# Patient Record
Sex: Female | Born: 1980 | Race: White | Hispanic: No | Smoking: Never smoker
Health system: Southern US, Community
[De-identification: ages and names within clinical notes are randomized; demographics above are authoritative.]

## PROBLEM LIST (undated history)

## (undated) DIAGNOSIS — J45909 Unspecified asthma, uncomplicated: Secondary | ICD-10-CM

## (undated) DIAGNOSIS — K219 Gastro-esophageal reflux disease without esophagitis: Secondary | ICD-10-CM

## (undated) DIAGNOSIS — R519 Headache, unspecified: Secondary | ICD-10-CM

## (undated) DIAGNOSIS — R17 Unspecified jaundice: Secondary | ICD-10-CM

## (undated) DIAGNOSIS — N390 Urinary tract infection, site not specified: Secondary | ICD-10-CM

## (undated) DIAGNOSIS — G43909 Migraine, unspecified, not intractable, without status migrainosus: Secondary | ICD-10-CM

## (undated) DIAGNOSIS — R51 Headache: Secondary | ICD-10-CM

## (undated) HISTORY — DX: Gastro-esophageal reflux disease without esophagitis: K21.9

## (undated) HISTORY — DX: Headache, unspecified: R51.9

## (undated) HISTORY — DX: Headache: R51

## (undated) HISTORY — DX: Unspecified asthma, uncomplicated: J45.909

## (undated) HISTORY — DX: Urinary tract infection, site not specified: N39.0

## (undated) HISTORY — DX: Migraine, unspecified, not intractable, without status migrainosus: G43.909

## (undated) HISTORY — PX: WISDOM TOOTH EXTRACTION: SHX21

## (undated) HISTORY — DX: Unspecified jaundice: R17

---
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
## 2012-07-21 LAB — HM PAP SMEAR

## 2012-10-30 ENCOUNTER — Encounter (HOSPITAL_COMMUNITY): Payer: Self-pay | Admitting: Emergency Medicine

## 2012-10-30 ENCOUNTER — Emergency Department (INDEPENDENT_AMBULATORY_CARE_PROVIDER_SITE_OTHER)
Admission: EM | Admit: 2012-10-30 | Discharge: 2012-10-30 | Disposition: A | Discharge status: Home / Self Care | Payer: BC Managed Care – PPO | Source: Home / Self Care | Attending: Family Medicine | Admitting: Family Medicine

## 2012-10-30 DIAGNOSIS — M25519 Pain in unspecified shoulder: Secondary | ICD-10-CM

## 2012-10-30 DIAGNOSIS — M545 Low back pain: Secondary | ICD-10-CM
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
## 2012-11-03 NOTE — ED Provider Notes (Signed)
Medical screening examination/treatment/procedure(s) were performed by a resident physician or non-physician practitioner and as the supervising physician I was immediately available for consultation/collaboration.  [place] Kestner, MD    Alwin Lanigan S Marvelle Caudill, MD 11/03/12 0744 

## 2012-12-07 ENCOUNTER — Encounter: Payer: Self-pay | Admitting: Family Medicine

## 2012-12-07 ENCOUNTER — Ambulatory Visit (INDEPENDENT_AMBULATORY_CARE_PROVIDER_SITE_OTHER): Payer: BC Managed Care – PPO | Admitting: Family Medicine

## 2012-12-07 VITALS — BP 110/70 | Temp 98.2°F | Ht 64.0 in | Wt 196.0 lb

## 2012-12-07 DIAGNOSIS — E669 Obesity, unspecified: Secondary | ICD-10-CM

## 2012-12-07 DIAGNOSIS — Z13220 Encounter for screening for lipoid disorders: Secondary | ICD-10-CM

## 2012-12-07 DIAGNOSIS — R946 Abnormal results of thyroid function studies: Secondary | ICD-10-CM

## 2012-12-07 DIAGNOSIS — Z23 Encounter for immunization: Secondary | ICD-10-CM

## 2012-12-07 DIAGNOSIS — Z131 Encounter for screening for diabetes mellitus: Secondary | ICD-10-CM

## 2012-12-07 DIAGNOSIS — Z7689 Persons encountering health services in other specified circumstances: Secondary | ICD-10-CM

## 2012-12-07 DIAGNOSIS — Z7189 Other specified counseling: Secondary | ICD-10-CM

## 2012-12-07 NOTE — Patient Instructions (Signed)
We recommend the following healthy lifestyle measures: - eat a healthy diet consisting of lots of vegetables, fruits, beans, nuts, seeds, healthy meats such as white chicken and fish and whole grains.  - avoid fried foods, fast food, processed foods, sodas, red meet and other fattening foods.  - get a least 150 minutes of aerobic exercise per week.   -We have ordered labs or studies at this visit. It can take up to 1-2 weeks for results and processing. We will contact you with instructions IF your results are abnormal. Normal results will be released to your [facility]. If you have not heard from [place] or can not find your results in [facility] in 2 weeks please contact our office.  -follow up for a physical at your own convenience

## 2012-12-07 NOTE — Progress Notes (Signed)
Chief Complaint  Patient presents with  . Establish Care    HPI:  Kayla Mason is here to establish care.  Last PCP and physical: Sees Dr. Chevis Pretty in gyn - goes yearly and normal exams  Has the following chronic problems and concerns today: Wants to establish care.   Abnormal TFT: - at gyn about 1 year ago - she never rechecked -no weight changes, skin issues, constipation, palpitations, heat or cold intolerance  There are no active problems to display for this patient.  Health Maintenance: -refused flu vaccine -wants tdap vaccine  ROS: See pertinent positives and negatives per HPI.  Past Medical History  Diagnosis Date  . Asthma   . Frequent headaches   . GERD (gastroesophageal reflux disease)   . Jaundice     as newborn  . Migraines   . UTI (lower urinary tract infection)     Family History  Problem Relation Age of Onset  . Arthritis Mother   . Breast cancer Mother   . Diabetes Mother   . Hyperlipidemia Father   . Hyperlipidemia Paternal Aunt   . Hypertension Paternal Aunt   . Ovarian cancer Paternal Aunt   . Prostate cancer Paternal Uncle     History   Social History  . Marital Status: Divorced    Spouse Name: N/A    Number of Children: N/A  . Years of Education: N/A   Social History Main Topics  . Smoking status: Never Smoker   . Smokeless tobacco: None  . Alcohol Use: No  . Drug Use: No  . Sexual Activity: Yes    Birth Control/ Protection: IUD   Other Topics Concern  . None   Social History Narrative   Work or School: [employer]      Home Situation: Lives with cousin and daughter      Spiritual Beliefs: Roman Catholic; Media planner      Lifestyle: walks 2x per week; diet is is not great - she does watch portion sizes             Current outpatient prescriptions:levonorgestrel (MIRENA) 20 MCG/24HR IUD, 1 each by Intrauterine route once., Disp: , Rfl: ;  ibuprofen (ADVIL,MOTRIN) 200 MG tablet, Take 200 mg by mouth every 6 (six) hours as  needed., Disp: , Rfl:   EXAM:  Filed Vitals:   12/07/12 1628  BP: 110/70  Temp: 98.2 F (36.8 C)    Body mass index is 33.63 kg/(m^2).  GENERAL: vitals reviewed and listed above, alert, oriented, appears well hydrated and in no acute distress  HEENT: atraumatic, conjunttiva clear, no obvious abnormalities on inspection of external nose and ears  NECK: no obvious masses on inspection  LUNGS: clear to auscultation bilaterally, no wheezes, rales or rhonchi, good air movement  CV: HRRR, no peripheral edema  MS: moves all extremities without noticeable abnormality  PSYCH: pleasant and cooperative, no obvious depression or anxiety  ASSESSMENT AND PLAN:  Discussed the following assessment and plan:  Encounter to establish care  Borderline abnormal TFTs - Plan: TSH, T4, Free  Screening for diabetes mellitus - Plan: Hemoglobin A1c  Screening for cholesterol level - Plan: Lipid Panel  Obesity, unspecified - Plan: Lipid Panel, Hemoglobin A1c   -We reviewed the PMH, PSH, FH, SH, Meds and Allergies. -We provided refills for any medications we will prescribe as needed. -We addressed current concerns per orders and patient instructions. -We have asked for records for pertinent exams, studies, vaccines and notes from previous providers. -We have  advised patient to follow up per instructions below.   -Patient advised to return or notify a doctor immediately if symptoms worsen or persist or new concerns arise.  Patient Instructions  We recommend the following healthy lifestyle measures: - eat a healthy diet consisting of lots of vegetables, fruits, beans, nuts, seeds, healthy meats such as white chicken and fish and whole grains.  - avoid fried foods, fast food, processed foods, sodas, red meet and other fattening foods.  - get a least 150 minutes of aerobic exercise per week.   -We have ordered labs or studies at this visit. It can take up to 1-2 weeks for results and  processing. We will contact you with instructions IF your results are abnormal. Normal results will be released to your [facility]. If you have not heard from [place] or can not find your results in [facility] in 2 weeks please contact our office.  -follow up for a physical at your own convenience             KIM, [place] R.

## 2012-12-07 NOTE — Progress Notes (Signed)
Pre visit review using our clinic review tool, if applicable. No additional management support is needed unless otherwise documented below in the visit note. 

## 2012-12-08 LAB — LIPID PANEL
Cholesterol: 145 mg/dL (ref 0–200)
HDL: 41.4 mg/dL (ref >39.00)
LDL Cholesterol: 79 mg/dL (ref 0–99)
Total CHOL/HDL Ratio: 4
Triglycerides: 124 mg/dL (ref 0.0–149.0)
VLDL: 24.8 mg/dL (ref 0.0–40.0)

## 2012-12-08 LAB — TSH: TSH: 1.03 u[IU]/mL (ref 0.35–5.50)

## 2012-12-08 LAB — HEMOGLOBIN A1C: Hgb A1c MFr Bld: 5.5 % (ref 4.6–6.5)

## 2012-12-08 NOTE — Progress Notes (Signed)
Quick Note:  Called and spoke with pt and pt is aware. ______ 

## 2012-12-08 NOTE — Addendum Note (Signed)
Addended by: Azucena Freed on: 12/08/2012 08:25 AM   Modules accepted: Orders

## 2013-01-26 ENCOUNTER — Encounter: Payer: Self-pay | Admitting: Family Medicine

## 2013-01-26 ENCOUNTER — Telehealth: Payer: Self-pay | Admitting: Family Medicine

## 2013-01-26 ENCOUNTER — Ambulatory Visit (INDEPENDENT_AMBULATORY_CARE_PROVIDER_SITE_OTHER): Payer: BC Managed Care – PPO | Admitting: Family Medicine

## 2013-01-26 VITALS — BP 120/68 | Temp 98.0°F | Wt 199.0 lb

## 2013-01-26 DIAGNOSIS — N63 Unspecified lump in unspecified breast: Secondary | ICD-10-CM

## 2013-01-26 DIAGNOSIS — L989 Disorder of the skin and subcutaneous tissue, unspecified: Secondary | ICD-10-CM

## 2013-01-26 MED ORDER — DOXYCYCLINE HYCLATE 100 MG PO TABS: 100.0000 mg | ORAL_TABLET | Freq: Two times a day (BID) | ORAL | Status: DC

## 2013-01-26 NOTE — Patient Instructions (Signed)
-  We placed a referral for you as discussed to the breast center for imaging and to the dermatologist. It usually takes about 1-2 weeks to process and schedule this referral. If you have not heard from us regarding this appointment in 2 weeks please contact our office.  --As we discussed, we have prescribed a new medication (doxycycline) for you at this appointment. We discussed the common and serious potential adverse effects of this medication and you can review these and more with the pharmacist when you pick up your medication.  Please follow the instructions for use carefully and notify us immediately if you have any problems taking this medication.  -if worsening or new symptoms follow up immediately, otherwise f/u in 3 weeks
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
## 2013-01-26 NOTE — Telephone Encounter (Signed)
Pt is scheduled to see Dr. Doreen BeamWhitworth on Friday 01/29/13 at 10 am.  I already called the pt and left her a vm with the appointment information.

## 2013-01-26 NOTE — Progress Notes (Signed)
Pre visit review using our clinic review tool, if applicable. No additional management support is needed unless otherwise documented below in the visit note. 

## 2013-01-27 ENCOUNTER — Other Ambulatory Visit: Payer: Self-pay | Admitting: Family Medicine

## 2013-01-27 ENCOUNTER — Ambulatory Visit
Admission: RE | Admit: 2013-01-27 | Discharge: 2013-01-27 | Disposition: A | Discharge status: Home / Self Care | Payer: BC Managed Care – PPO | Source: Ambulatory Visit | Attending: Family Medicine | Admitting: Family Medicine

## 2013-01-27 DIAGNOSIS — N63 Unspecified lump in unspecified breast: Secondary | ICD-10-CM

## 2013-02-09 ENCOUNTER — Telehealth: Payer: Self-pay | Admitting: Family Medicine

## 2013-02-09 NOTE — Telephone Encounter (Signed)
Pt has seen dermatologist and had mammogram and would like to talk with nurse concerning the appts

## 2013-02-09 NOTE — Telephone Encounter (Signed)
Called and spoke with pt and pt states she seen dermatology and a culture was taken and it came back negative and a biopsy will be done next.  Pt states she goes back in 2 weeks to have the stitches removed.  Pt states the antibiotic given did not help pt and she is not surprised due to the culture being negative, Pt states she has a follow up with the breast center soon to do more testing as well.  Pt wanted to make sure Dr. Selena BattenKim stayed in the loop.

## 2013-02-10 ENCOUNTER — Other Ambulatory Visit: Payer: Self-pay | Admitting: Family Medicine

## 2013-02-10 ENCOUNTER — Ambulatory Visit
Admission: RE | Admit: 2013-02-10 | Discharge: 2013-02-10 | Disposition: A | Discharge status: Home / Self Care | Payer: BC Managed Care – PPO | Source: Ambulatory Visit | Attending: Family Medicine | Admitting: Family Medicine

## 2013-02-10 DIAGNOSIS — N63 Unspecified lump in unspecified breast: Secondary | ICD-10-CM

## 2013-02-16 ENCOUNTER — Ambulatory Visit (INDEPENDENT_AMBULATORY_CARE_PROVIDER_SITE_OTHER): Payer: BC Managed Care – PPO | Admitting: Family Medicine

## 2013-02-16 ENCOUNTER — Encounter: Payer: Self-pay | Admitting: Family Medicine

## 2013-02-16 VITALS — BP 100/74 | Temp 98.0°F | Wt 201.0 lb

## 2013-02-16 DIAGNOSIS — N63 Unspecified lump in unspecified breast: Secondary | ICD-10-CM
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
## 2013-02-16 NOTE — Progress Notes (Signed)
Pre visit review using our clinic review tool, if applicable. No additional management support is needed unless otherwise documented below in the visit note. 

## 2013-02-17 ENCOUNTER — Other Ambulatory Visit: Payer: Self-pay | Admitting: Dermatology

## 2013-03-08 ENCOUNTER — Ambulatory Visit (INDEPENDENT_AMBULATORY_CARE_PROVIDER_SITE_OTHER): Payer: BC Managed Care – PPO | Admitting: Infectious Disease

## 2013-03-08 ENCOUNTER — Ambulatory Visit
Admission: RE | Admit: 2013-03-08 | Discharge: 2013-03-08 | Disposition: A | Discharge status: Home / Self Care | Payer: BC Managed Care – PPO | Source: Ambulatory Visit | Attending: Infectious Disease | Admitting: Infectious Disease

## 2013-03-08 ENCOUNTER — Encounter: Payer: Self-pay | Admitting: Infectious Disease

## 2013-03-08 VITALS — BP 130/85 | HR 94 | Temp 98.4°F | Wt 201.0 lb

## 2013-03-08 DIAGNOSIS — G43909 Migraine, unspecified, not intractable, without status migrainosus: Secondary | ICD-10-CM | POA: Insufficient documentation

## 2013-03-08 DIAGNOSIS — R0609 Other forms of dyspnea: Secondary | ICD-10-CM | POA: Insufficient documentation

## 2013-03-08 DIAGNOSIS — R0989 Other specified symptoms and signs involving the circulatory and respiratory systems: Secondary | ICD-10-CM

## 2013-03-08 DIAGNOSIS — N611 Abscess of the breast and nipple: Secondary | ICD-10-CM | POA: Insufficient documentation

## 2013-03-08 DIAGNOSIS — J441 Chronic obstructive pulmonary disease with (acute) exacerbation: Secondary | ICD-10-CM

## 2013-03-08 DIAGNOSIS — N61 Mastitis without abscess: Secondary | ICD-10-CM

## 2013-03-08 DIAGNOSIS — I889 Nonspecific lymphadenitis, unspecified: Secondary | ICD-10-CM

## 2013-03-08 DIAGNOSIS — L929 Granulomatous disorder of the skin and subcutaneous tissue, unspecified: Secondary | ICD-10-CM

## 2013-03-08 DIAGNOSIS — L980 Pyogenic granuloma: Secondary | ICD-10-CM

## 2013-03-08 DIAGNOSIS — J45901 Unspecified asthma with (acute) exacerbation: Secondary | ICD-10-CM

## 2013-03-08 LAB — CBC WITH DIFFERENTIAL/PLATELET
BASOS PCT: 1 % (ref 0–1)
Basophils Absolute: 0.1 10*3/uL (ref 0.0–0.1)
Eosinophils Absolute: 0.4 10*3/uL (ref 0.0–0.7)
Eosinophils Relative: 4 % (ref 0–5)
HEMATOCRIT: 36.5 % (ref 36.0–46.0)
Hemoglobin: 12.2 g/dL (ref 12.0–15.0)
LYMPHS PCT: 28 % (ref 12–46)
Lymphs Abs: 2.7 10*3/uL (ref 0.7–4.0)
MCH: 28.6 pg (ref 26.0–34.0)
MCHC: 33.4 g/dL (ref 30.0–36.0)
MCV: 85.7 fL (ref 78.0–100.0)
MONO ABS: 1 10*3/uL (ref 0.1–1.0)
Monocytes Relative: 10 % (ref 3–12)
NEUTROS ABS: 5.6 10*3/uL (ref 1.7–7.7)
NEUTROS PCT: 57 % (ref 43–77)
Platelets: 376 10*3/uL (ref 150–400)
RBC: 4.26 MIL/uL (ref 3.87–5.11)
RDW: 13.6 % (ref 11.5–15.5)
WBC: 9.8 10*3/uL (ref 4.0–10.5)

## 2013-03-08 NOTE — Addendum Note (Signed)
Addended by: Mariea ClontsGREEN, Kendyll Huettner D on: 03/08/2013 04:17 PM   Modules accepted: Orders

## 2013-03-08 NOTE — Progress Notes (Signed)
Subjective:    Patient ID: Kayla Mason, female    DOB: [birth year], 33 y.o.   MRN: [id number]  HPI  33 year old Caucasian lady with past medical history significant for migraine headaches to this November developed a lesion on her back. She states this was shortly before Thanksgiving is an area that became indurated red warm and tender to the touch. She tried to drain this her self by cleaning off the or at with paralyzing material in place and a needle in there. Blood return. Unfortunately the lesion did not resolve but persisted. Shortly before Christmas when she returned to [place] to visit her mother on Christmas Eve her mother examine the area and also tried to drain it again with a needle. Again only bloody material came forth. This warm tender nodule on her back has persisted since November through present date. Since then she also began noticing on New Year's Eve on 01/20/2013 a hard round area her left breast near her lymph nodes. This August he also distress tear.  She sought evaluation with her primary care doctor on 01/26/2013.  Primary care physician started her on doxycycline and referred her to dermatology as well as for an ultrasound of the breast.  Patient was seen by Dr. Jeanett Schlein [facility] on January [facility]. On exam he noted a small nontender breast nodule on the lateral left breast as well as a warm fluctuant area on her back. He performed an incision on the lesion on the back which yielded copious thin yellow discharge. The area diminished in size. Cultures were sent for bacteria and these were negative.  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 The histologic picture was thought to be consistent with that found with cat scratch disease. The Warthin's starry stain was negative. The GMS PAS and AFB stains were also all negative. I do not think that any cultures were sent from that breast biopsy.  Dr. Elvera Lennox performed a biopsy on the left upper back area via punch biopsy technique and this also showed: Supportive and granulomatous dermatitis and panniculitis. This was also looked at with AFB and PAS stains which were negative. Cultures done on antibiotics and yielded no organism. Since then he repeated another biopsy on January 28 which was a punch biopsy mid upper back which showed what looked like an epidermoid cyst with inflamed or disrupted findings. There is no atypia and then PAS and fight and AFB stains were all negative. I believe AFB and fungal cultures were obtained at that time.  Is concern for possible Bartonella infection Dr. Elvera Lennox sent for serologies for Bartonella . All of these were negative. With her saw the patient again on February 11 was done the patient had worsening of her lateral breast lesion as well as worsening induration and erythema the skin. He injected some Kenalog into the back lesion and drained also the large bore needle. Patient was also given azithromycin course which she is just finishing.  On close questioning the patient has never had problems with recurrent infections as a child she does over the migraine headaches at one point had a central line inserted while she was in [place].  [place] having recently begun graduate school at [employer]. She has traveled to [place] and kayak along the [place] in an area part of [place]. She has no known tuberculosis history of exposure  She is a monogamous relationship with her partner.  She states she was likely tested for HIV when she donated blood to  the blood test back.  She does not have a family history that is positive for connective tissue disease sarcoidosis. There is also no history of inflammatory bowel disease  Review of systems she noted recent intermittent dyspnea on exertion cause of which is unclear. His been happening for the last month. Review of Systems  Constitutional: Positive for appetite change and fatigue. Negative for fever, chills, diaphoresis, activity change and unexpected weight change.  HENT: Negative for congestion, rhinorrhea, sinus pressure, sneezing, sore throat and trouble swallowing.   Eyes: Negative for photophobia and visual disturbance.  Respiratory: Positive for shortness of breath. Negative for cough, chest tightness, wheezing and stridor.   Cardiovascular: Negative for chest pain, palpitations and leg swelling.  Gastrointestinal: Negative for nausea, vomiting, abdominal pain, diarrhea, constipation, blood in stool, abdominal distention and anal bleeding.  Genitourinary: Negative for dysuria, hematuria, flank pain and difficulty urinating.  Musculoskeletal: Negative for arthralgias, back pain, gait problem, joint swelling and myalgias.  Skin: Positive for color change and rash. Negative for pallor and wound.  Neurological: Negative for dizziness, tremors, weakness and light-headedness.  Hematological: Positive for adenopathy. Does not bruise/bleed easily.  Psychiatric/Behavioral: Negative for behavioral problems, confusion, sleep disturbance, dysphoric mood, decreased concentration and agitation.       Objective:   Physical Exam  Constitutional: She is oriented to person, place, and time. She appears well-developed and well-nourished. No distress.  HENT:  Head: Normocephalic and atraumatic.  Mouth/Throat: Oropharynx is clear and moist. No oropharyngeal exudate.  Eyes: Conjunctivae and EOM are normal. No scleral icterus.  Neck: Normal range of motion. Neck supple. No JVD present.  Cardiovascular:  Normal rate, regular rhythm and normal heart sounds.  Exam reveals no gallop and no friction rub.   No murmur heard. Pulmonary/Chest: Effort normal and breath sounds normal. No respiratory distress. She has no wheezes. She has no rales. She exhibits no tenderness.    Abdominal: She exhibits no distension. There is no tenderness. There is no rebound and no guarding.  Musculoskeletal: She exhibits no edema and no tenderness.  Lymphadenopathy:    She has no cervical adenopathy.  Neurological: She is alert and oriented to person, place, and time. She exhibits normal muscle tone. Coordination normal.  Skin: Skin is warm and dry. She is not diaphoretic. No erythema. No pallor.  Psychiatric: She has a normal mood and affect. Her behavior is normal. Judgment and thought content normal.   Area that is indurated firm and tender to palpation, biopsy site is clean     Back lesion with erythema, scarring, also quite tender             Assessment & Plan:   Multiple necrotizing granulomas:  Fascinating patient.  Given the fact that there are multiple areas involved in apparently unrelated distribution with no clear-cut exposure history I think we clearly need to worry about possibility that this might be a declaration of chronic granulomatous disease albeit later in life than is typically the time of presentation. Also should consider inflammatory bowel disease and the possibility of Crohn's or ulcers colitis, Sarcoid, CTD.  We will check  a neutrophil burst test, immunoglobulins IgE and Immunoglobulins, HIV test, PANCA, ASCA, ACE level.   I will check CBC , CMP, ESR, CRP. I will check QUANTIFERON GOLD, cryptococcal antigen, serum Coccidioides antibodies, urine histoplasma, blastomyces antigen from urine, repeat Bartonella antibodies.  The chronicity of this condition early points to an atypical cause of infection such as a nontuberculous mycobacteria fungal infection AND to a likely abnormal  underlying immune system.  I propose placing the patient on an empiric anti NTM regimen of clarithromycin and avelox but he was very reluctant to start antibiotics without were treating.  I told her that we might forced inducers approach if we could not find a culture proven culprit here. If he really wants no more about the potential culprit organism if this is in fact infectious disease we may need repeat biopsies of the breast and/or scan.  Given her recent dyspnea on exertion a chest x-ray is also warranted in this vagus also potential targets through a bronchoscopy with BAL.  I also emphasized that we do make a diagnosis of chronic renal disease now with her right infection need a protracted course of antibiotics but she would need prophylactic antibiotics for the rest of her life.   I spent greater than 60 minutes with the patient including greater than 50% of time in face to face counsel of the patient and in coordination of their care.  Dyspnea on exertion: This is intermittent in nature as mentioned above we'll get a chest x-ray she is not normally on oral contraceptives she does have a Marina ring in place and was recently given a short course of progesterone but her symptoms preceded being given these medicines. I don't think pulmonary embolism is particularly likely.

## 2013-03-08 NOTE — Addendum Note (Signed)
Addended by: Mariea ClontsGREEN, Prince Olivier D on: 03/08/2013 04:14 PM   Modules accepted: Orders

## 2013-03-09 LAB — HIV ANTIBODY (ROUTINE TESTING W REFLEX): HIV: NONREACTIVE

## 2013-03-09 LAB — IGG, IGA, IGM
IgA: 200 mg/dL (ref 69–380)
IgG (Immunoglobin G), Serum: 1160 mg/dL (ref 690–1700)
IgM, Serum: 146 mg/dL (ref 52–322)

## 2013-03-09 LAB — IGE: IgE (Immunoglobulin E), Serum: 18.3 IU/mL (ref 0.0–180.0)

## 2013-03-09 LAB — CRYPTOCOCCAL ANTIGEN: CRYPTO AG: NEGATIVE

## 2013-03-09 LAB — ANA: ANA: NEGATIVE

## 2013-03-09 LAB — SEDIMENTATION RATE: SED RATE: 13 mm/h (ref 0–22)

## 2013-03-09 LAB — C-REACTIVE PROTEIN: CRP: <0.5 mg/dL (ref <0.60)

## 2013-03-10 LAB — SACCHAROMYCES CEREVISIAE ANTIBODIES, IGG AND IGA
Saccharomyces cerevisiae IgG: 10.2 U (ref <=20.0)
Saccharomyces cerevisiae, IgA: 6.6 U (ref <=20.0)

## 2013-03-10 LAB — QUANTIFERON TB GOLD ASSAY (BLOOD)
Interferon Gamma Release Assay: NEGATIVE
MITOGEN VALUE: 7.68 [IU]/mL
QUANTIFERON TB AG MINUS NIL: 0.02 [IU]/mL
Quantiferon Nil Value: 0.04 IU/mL
TB Ag value: 0.06 IU/mL

## 2013-03-10 LAB — PAN-ANCA
ATYPICAL P-ANCA SCREEN: NEGATIVE
Myeloperoxidase Abs: <1
c-ANCA Screen: NEGATIVE
p-ANCA Screen: NEGATIVE

## 2013-03-10 LAB — ANGIOTENSIN CONVERTING ENZYME: Angiotensin-Converting Enzyme: 21 U/L (ref 8–52)

## 2013-03-11 LAB — BARTONELLA ANITBODY PANEL

## 2013-03-11 LAB — BARTONELLA ANTIBODY PANEL
B henselae IgG: NEGATIVE
B henselae IgM: NEGATIVE

## 2013-03-11 LAB — COCCIDIOIDES ANTIBODIES

## 2013-03-13 LAB — HISTOPLASMA ANTIGEN, URINE: Histoplasma Antigen, urine: <0.5 ng/mL

## 2013-03-13 LAB — MVISTA BLASTOMYCES QNT AG, URINE

## 2013-03-22 ENCOUNTER — Encounter: Payer: Self-pay | Admitting: Infectious Disease

## 2013-03-22 ENCOUNTER — Ambulatory Visit (INDEPENDENT_AMBULATORY_CARE_PROVIDER_SITE_OTHER): Payer: BC Managed Care – PPO | Admitting: Infectious Disease

## 2013-03-22 VITALS — BP 136/82 | HR 98 | Temp 98.1°F | Ht 64.5 in | Wt 201.0 lb

## 2013-03-22 DIAGNOSIS — L929 Granulomatous disorder of the skin and subcutaneous tissue, unspecified: Secondary | ICD-10-CM

## 2013-03-22 DIAGNOSIS — L980 Pyogenic granuloma: Secondary | ICD-10-CM

## 2013-03-22 NOTE — Progress Notes (Signed)
Subjective:    Patient ID: Kayla Mason, female    DOB: 02/23/1980, 33 y.o.   MRN: [id number]  HPI   33 year old Caucasian lady with past medical history significant for migraine headaches to this November developed a lesion on her back. She states this was shortly before Thanksgiving is an area that became indurated red warm and tender to the touch. She tried to drain this her self by cleaning off the or at with paralyzing material in place and a needle in there. Blood return. Unfortunately the lesion did not resolve but persisted. Shortly before Christmas when she returned to [place] to visit her mother on Christmas Eve her mother examine the area and also tried to drain it again with a needle. Again only bloody material came forth. This warm tender nodule on her back has persisted since November through present date. Since then she also began noticing on New Year's Eve on 01/20/2013 a hard round area her left breast near her lymph nodes. This August he also distress tear.  She sought evaluation with her primary care doctor on 01/26/2013.  Primary care physician started her on doxycycline and referred her to dermatology as well as for an ultrasound of the breast.  Patient was seen by Dr. Jeanett Schlein [facility] on January 9. On exam he noted a small nontender breast nodule on the lateral left breast as well as a warm fluctuant area on her back. He performed an incision on the lesion on the back which yielded copious thin yellow discharge. The area diminished in size. Cultures were sent for bacteria and these were negative.  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 The histologic picture was thought to be consistent with that found with cat scratch disease. The Warthin's starry stain was negative. The GMS PAS and AFB stains were also all negative. I do not think that any cultures were sent from that breast biopsy.  Dr. Elvera Lennox performed a biopsy on the left upper back area via punch biopsy technique and this also showed: Supportive and granulomatous dermatitis and panniculitis. This was also looked at with AFB and PAS stains which were negative. Cultures done on antibiotics and yielded no organism. Since then he repeated another biopsy on January 28 which was a punch biopsy mid upper back which showed what looked like an epidermoid cyst with inflamed or disrupted findings. There is no atypia and then PAS and fight and AFB stains were all negative. I believe AFB and fungal cultures were obtained at that time.  Is concern for possible Bartonella infection Dr. Elvera Lennox sent for serologies for Bartonella . All of these were negative. With her saw the patient again on February 11 was done the patient had worsening of her lateral breast lesion as well as worsening induration and erythema the skin. He injected some Kenalog into the back lesion and drained also the large bore needle. Patient was also given azithromycin course which she is just finishing.  On close questioning the patient has never had problems with recurrent infections as a child she does over the migraine headaches at one point had a central line inserted while she was in [place].  She has  lived largely in the [place] but also in the [place] having recently begun graduate school at [employer]. She has traveled to [place] and [place] in an area part of [place]. She has no known tuberculosis history of exposure  She is a monogamous relationship with her partner.  She states she was likely tested for HIV when she donated blood to  the blood test back.  She does not have a family history that is positive for connective tissue disease sarcoidosis. There is also no history of inflammatory bowel disease.   We initiated further workup here including for infection (negative histoplasma antigen, blastomyces antigen. Serum cryptococcal antigen, negative quantiferon gold. We tested her again for HIV and this was negative.Repeat Bartonella abs were negative. Cocci abs were sent.  We assessed her Neutrophil function with DHR test and it was normal. Immunoglobulins were normal. ESR was normal. ANA negative. ANCA's negative. ACE level negative. I sent ASCA antibodies which were negative (in case this was Crohn's extra-intestinal manifestation. )  Since I last saw her she has remained off antibiotics. Her lesion on her back is becoming more full and tender and she is developing apparent new lesions adjacent to her lateral chest wall that are tender. She is without fevers, chills, malaise. I still cannot isolate any specific common exposure for these sites--such as hot tub etc. She has AFB and fungal cultures incubating at Dr. Juanna Cao office.  . Review of Systems  Constitutional: Positive for appetite change and fatigue. Negative for fever, chills, diaphoresis, activity change and unexpected weight change.  HENT: Negative for congestion, rhinorrhea, sinus pressure, sneezing, sore throat and trouble swallowing.   Eyes: Negative for photophobia and visual disturbance.  Respiratory: Positive for shortness of breath. Negative for cough, chest tightness, wheezing and stridor.   Cardiovascular: Negative for chest pain, palpitations and leg swelling.  Gastrointestinal: Negative for nausea, vomiting, abdominal pain, diarrhea, constipation, blood in stool, abdominal distention and anal bleeding.  Genitourinary: Negative for dysuria, hematuria, flank pain and difficulty urinating.  Musculoskeletal: Negative for arthralgias, back pain, gait  problem, joint swelling and myalgias.  Skin: Positive for color change and rash. Negative for pallor and wound.  Neurological: Negative for dizziness, tremors, weakness and light-headedness.  Hematological: Positive for adenopathy. Does not bruise/bleed easily.  Psychiatric/Behavioral: Negative for behavioral problems, confusion, sleep disturbance, dysphoric mood, decreased concentration and agitation.       Objective:   Physical Exam  Constitutional: She is oriented to person, place, and time. She appears well-developed and well-nourished. No distress.  HENT:  Head: Normocephalic and atraumatic.  Mouth/Throat: Oropharynx is clear and moist. No oropharyngeal exudate.  Eyes: Conjunctivae and EOM are normal. No scleral icterus.  Neck: Normal range of motion. Neck supple. No JVD present.  Cardiovascular: Normal rate, regular rhythm and normal heart sounds.  Exam reveals no gallop and no friction rub.   No murmur heard. Pulmonary/Chest: Effort normal and breath sounds normal. No respiratory distress. She has no wheezes. She has no rales. She exhibits no tenderness.    Abdominal: She exhibits no distension. There is no tenderness. There is no rebound and no guarding.  Musculoskeletal: She exhibits no edema and no tenderness.  Lymphadenopathy:    She has no cervical adenopathy.  Neurological: She is alert and oriented to person, place, and time. She exhibits normal muscle tone. Coordination normal.  Skin: Skin is warm and dry. She is not diaphoretic. No erythema. No pallor.  Psychiatric: She has a normal mood  and affect. Her behavior is normal. Judgment and thought content normal.   Area that is indurated firm and tender to palpation, biopsy site is clean  Last exam 03/08/13 :      TODAY's eXAM 03/22/13:       Back lesion with erythema, scarring, also quite tender  LAST EXAM 2/16 / 15       TODAY's exam 03/22/13:         Assessment & Plan:   Multiple necrotizing  granulomas:  Fascinating case. I do begin to wonder more and more about a non-ID cause given chronicity and fact that one lesion responded to injection of steroid--though the other one (breast/chest wall) also improved without steroids.  I am sending tissue trans glutaminase in case this is an extra intestinal manifestation of Celiac.  I would like to see what cultures done by Dr. Elvera Lennox have shown  I would, if we cannot get a definitive diagnosis be in favor of repeat more aggressive biopsy. If lesions continue to grow perhaps Dr. Elvera Lennox could take a large sample , or we could even consider surgical excision. With a large section, repeat pathological exam could be performed along with repeat stains for AFB, fungi, bacteria, stains for Whipple's and certainly DEDICATED TISSUE for AFB stain and culture, and fungal stain and culture as well as culture for Nocardia (on buffered charcoal yeast agar)   I spent greater than 25 minutes with the patient including greater than 50% of time in face to face counsel of the patient and in coordination of their care.

## 2013-03-23 LAB — TISSUE TRANSGLUTAMINASE, IGA: TISSUE TRANSGLUTAMINASE AB, IGA: 2.9 U/mL (ref <20)

## 2013-03-31 ENCOUNTER — Telehealth: Payer: Self-pay | Admitting: Oncology

## 2013-03-31 NOTE — Telephone Encounter (Signed)
S/W PATIENT AND GAVE NEW PATIENT APPT FOR 03/12 @ 1:30 W/DR.SHADAD.  REFERRING DR. Corliss SkainsEVESHWAR DX-LYMPHADENITIS

## 2013-03-31 NOTE — Telephone Encounter (Signed)
C/D 03/31/13 for appt. 04/01/13 °

## 2013-04-01 ENCOUNTER — Encounter: Payer: Self-pay | Admitting: Oncology

## 2013-04-01 ENCOUNTER — Ambulatory Visit (HOSPITAL_BASED_OUTPATIENT_CLINIC_OR_DEPARTMENT_OTHER): Payer: BC Managed Care – PPO | Admitting: Oncology

## 2013-04-01 ENCOUNTER — Other Ambulatory Visit: Payer: BC Managed Care – PPO

## 2013-04-01 ENCOUNTER — Ambulatory Visit: Payer: BC Managed Care – PPO

## 2013-04-01 ENCOUNTER — Telehealth: Payer: Self-pay | Admitting: Oncology

## 2013-04-01 VITALS — BP 150/77 | HR 66 | Temp 98.3°F | Resp 20 | Ht 64.5 in | Wt 200.8 lb

## 2013-04-01 DIAGNOSIS — L929 Granulomatous disorder of the skin and subcutaneous tissue, unspecified: Secondary | ICD-10-CM

## 2013-04-01 DIAGNOSIS — I881 Chronic lymphadenitis, except mesenteric: Secondary | ICD-10-CM

## 2013-04-01 NOTE — Progress Notes (Signed)
Please see consult note.  

## 2013-04-01 NOTE — Progress Notes (Signed)
Checked in new pt with no financial concerns. °

## 2013-04-01 NOTE — Telephone Encounter (Signed)
gv pt barium °

## 2013-04-01 NOTE — Consult Note (Signed)
Reason for Referral: Lymphadenitis.   HPI: 33 year old woman currently of [place] for the last 4-1/2 years. She lived in multiple locations most recently in the [place] area. She does have a past medical history significant for migraine headaches otherwise healthy. She was in her normal state of health still  November on 2014 when developed a lesion on her back. This warm tender nodule on her back has persisted since November through present date. Since then she also began noticing on New Year's Eve on 01/20/2013 a hard round area her left breast near her lymph nodes. He was evaluated by her rimary care physician and started her on doxycycline and referred her to dermatology as well as for an ultrasound of the breast.  Patient was seen by Dr. Jeanett Schlein from [facility] on January 9. On exam he noted a small nontender breast nodule on the lateral left breast as well as a warm fluctuant area on her back. He performed an incision on the lesion on the back which yielded copious thin yellow discharge. The area diminished in size. Cultures were sent for bacteria and these were negative.  He was also evaluated by the breast Center her potential chest wall mass/breast mass. 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 The histologic picture was thought to be consistent with that found with cat scratch disease. The Warthin's starry stain was negative. The GMS PAS and AFB stains were also all negative.   Dr. Elvera Lennox performed a biopsy on the left upper back area via punch biopsy technique and this also showed: Supportive and granulomatous dermatitis and panniculitis.   She was evaluated by Dr. Tommy Medal from infectious disease and her evaluation have been unremarkable. She had normal vital serology including HIV among other panel.  Shows other normals CBC and differential.   She will is referred to me for evaluation regarding these findings. Clinically otherwise, she is completely asymptomatic. She is not reporting any fevers or chills or sweats. She has not reported any weight loss her major appetite changes. Continue to perform activities of daily living without any hindrance or decline.   Past Medical History  Diagnosis Date  . Asthma   . Frequent headaches   . GERD (gastroesophageal reflux disease)   . Jaundice     as newborn  . Migraines   . UTI (lower urinary tract infection)   :  Past Surgical History  Procedure Laterality Date  . Pic line/shunt  1998    for migraines/hospitalization  . Wisdom tooth extraction    :  Current Outpatient Prescriptions  Medication Sig Dispense Refill  . cetirizine (ZYRTEC) 10 MG tablet Take 10 mg by mouth daily.      Marland Kitchen ibuprofen (ADVIL,MOTRIN) 200 MG tablet Take 200 mg by mouth every 6 (six) hours as needed.       No current facility-administered medications for this visit.     Allergies  Allergen Reactions  . Witch Hazel   :  Family History  Problem Relation Age of Onset  . Arthritis Mother   . Breast cancer Mother   . Diabetes Mother   . Hyperlipidemia Father   . Hyperlipidemia Paternal Aunt   . Hypertension Paternal Aunt   . Ovarian cancer Paternal Aunt   . Prostate cancer Paternal Uncle   :  History   Social History  . Marital Status: Divorced  Spouse Name: N/A    Number of Children: N/A  . Years of Education: N/A   Occupational History  . Not on file.   Social History Main Topics  . Smoking status: Never Smoker   . Smokeless tobacco: Never Used  . Alcohol Use: No  . Drug Use: No  . Sexual Activity: Yes    Birth Control/ Protection: IUD   Other Topics Concern  . Not on file   Social History Narrative   Work or School: [employer]      Home Situation: Lives with cousin and daughter      Spiritual Beliefs: Roman Nurse, learning disability; Clinical cytogeneticist       Lifestyle: walks 2x per week; diet is is not great - she does watch portion sizes           :  Constitutional: negative for anorexia, chills, fatigue and fevers Eyes: negative for icterus and irritation Ears, nose, mouth, throat, and face: negative for nasal congestion, snoring and sore mouth Respiratory: negative for asthma, cough and dyspnea on exertion Cardiovascular: negative for chest pain, chest pressure/discomfort and dyspnea Gastrointestinal: negative for abdominal pain, diarrhea, melena and nausea Genitourinary:negative for dysuria, frequency and hematuria Integument/breast: negative for nipple discharge and pruritus Hematologic/lymphatic: negative for bleeding, easy bruising and petechiae Musculoskeletal:negative for arthralgias, back pain and bone pain Neurological: negative for dizziness, seizures and vertigo Behavioral/Psych: negative for anxiety and depression Endocrine: negative for temperature intolerance Allergic/Immunologic: negative for hay fever and urticaria  Exam: Blood pressure 150/77, pulse 66, temperature 98.3 F (36.8 C), temperature source Oral, resp. rate 20, height 5' 4.5" (1.638 m), weight 200 lb 12.8 oz (91.082 kg), SpO2 100.00%. General appearance: alert, cooperative and appears stated age Nose: Nares normal. Septum midline. Mucosa normal. No drainage or sinus tenderness. Throat: lips, mucosa, and tongue normal; teeth and gums normal Neck: no adenopathy, no carotid bruit, no JVD, supple, symmetrical, trachea midline and thyroid not enlarged, symmetric, no tenderness/mass/nodules Back: symmetric, no curvature. ROM normal. No CVA tenderness., Slight erythema and induration noted on the left side of her mid back. Resp: clear to auscultation bilaterally Chest wall: no tenderness Cardio: regular rate and rhythm, S1, S2 normal, no murmur, click, rub or gallop GI: soft, non-tender; bowel sounds normal; no masses,  no organomegaly Extremities: extremities  normal, atraumatic, no cyanosis or edema Pulses: 2+ and symmetric Skin: Skin color, texture, turgor normal. No rashes or lesions Lymph nodes: Cervical, supraclavicular, and axillary nodes normal.   Assessment and Plan:   33 year old woman with a recent finding and a diagnosis of granulomatous necrotizing lymphadenitis. This was discovered on a lesion in her back as well as a lymph node biopsy that was done and confirmed the presence of this finding. She was evaluated by infectious disease and her viral serology was unremarkable. I see no evidence to suggest any malignant disorder she has a normal CBC as well as a benign biopsy at this time. The natural course of this disease as well as the differential diagnosis was discussed with the patient today. This could also be a sign of histiocytic necrotizing lymphadenitis also known as Kikuchi disease as well as could also be a sign of an autoimmune disorder. At this point I see no evidence to suggest any malignancy but certainly she could have an indolent lymphoma and could be manifesting itself with these abnormal lymph glands finding. For completeness sake I will obtain a CT scan of the neck chest abdomen and pelvis and there is no lymphadenopathy noted  on his exam I would probably do not feel that any further testing would be needed. I do not think that she needs a bone marrow biopsy and I have possibly would recommend a rheumatology evaluation if she continued to has diffuse involvement with this condition. All her questions are answered today

## 2013-04-06 ENCOUNTER — Telehealth: Payer: Self-pay | Admitting: *Deleted

## 2013-04-06 NOTE — Telephone Encounter (Signed)
Patient called requesting her office notes and labs be faxed to Dr. Orlin HildingAngela Mason, rheumatology. She will come by the office to sign a release. Kayla MolaJacqueline Mason

## 2013-04-27 ENCOUNTER — Encounter (HOSPITAL_COMMUNITY): Payer: Self-pay | Admitting: Pharmacist

## 2013-05-04 ENCOUNTER — Ambulatory Visit: Payer: BC Managed Care – PPO | Admitting: Infectious Disease

## 2013-05-05 NOTE — H&P (Addendum)
  33 yo with irregular bleeding and endometrial polyps presents for surgical mngt.  PMHx:  Migraines, Asthma PShx:  SVD All:  Witch hazel, phenobarbital, tylenol Meds:  Zyrtec, ibuprofen prn SHx:  No tobacco FHx:  H/o liver, ovarian, prostate, skin ca Gyn Hx:  LMP 4d ago.  Using OCP and condoms for contraception  AF, VSS Gen - NAD Abd - soft, NT CV - RRR Lungs - clear Ext - NT  US: 1.5 x 1.6cm polypoid mass in endometrial cavity.    A/P:  Irregular VB and enodmetrial polyps Hysteroscopy, D&C, removal of endometrial mass R/b/a discussed, questions answered, informed consent

## 2013-05-06 ENCOUNTER — Encounter (HOSPITAL_COMMUNITY): Payer: Self-pay | Admitting: Anesthesiology

## 2013-05-06 NOTE — Anesthesia Preprocedure Evaluation (Addendum)
Anesthesia Evaluation  Patient identified by MRN, date of birth, ID band Patient awake    Reviewed: Allergy & Precautions, H&P , NPO status , Patient's Chart, lab work & pertinent test results  Airway Mallampati: II TM Distance: >3 FB Neck ROM: Full    Dental no notable dental hx. (+) Teeth Intact   Pulmonary shortness of breath and with exertion, asthma ,  breath sounds clear to auscultation  Pulmonary exam normal       Cardiovascular + DOE Rhythm:Regular Rate:Normal     Neuro/Psych  Headaches, negative psych ROS   GI/Hepatic Neg liver ROS, GERD-  Medicated and Controlled,  Endo/Other  Obesity  Renal/GU negative Renal ROS  negative genitourinary   Musculoskeletal negative musculoskeletal ROS (+)   Abdominal (+) + obese,   Peds  Hematology negative hematology ROS (+)   Anesthesia Other Findings   Reproductive/Obstetrics Endometrial polyps                          Anesthesia Physical Anesthesia Plan  ASA: II  Anesthesia Plan: General   Post-op Pain Management:    Induction: Intravenous  Airway Management Planned: LMA  Additional Equipment:   Intra-op Plan:   Post-operative Plan: Extubation in OR  Informed Consent: I have reviewed the patients History and Physical, chart, labs and discussed the procedure including the risks, benefits and alternatives for the proposed anesthesia with the patient or authorized representative who has indicated his/her understanding and acceptance.   Dental advisory given  Plan Discussed with: Anesthesiologist  Anesthesia Plan Comments:         Anesthesia Quick Evaluation

## 2013-05-07 ENCOUNTER — Encounter (HOSPITAL_COMMUNITY): Payer: BC Managed Care – PPO | Admitting: Anesthesiology

## 2013-05-07 ENCOUNTER — Ambulatory Visit (HOSPITAL_COMMUNITY): Payer: BC Managed Care – PPO | Admitting: Anesthesiology

## 2013-05-07 ENCOUNTER — Ambulatory Visit (HOSPITAL_COMMUNITY)
Admission: RE | Admit: 2013-05-07 | Discharge: 2013-05-07 | Disposition: A | Discharge status: Home / Self Care | Payer: BC Managed Care – PPO | Source: Ambulatory Visit | Attending: Obstetrics and Gynecology | Admitting: Obstetrics and Gynecology

## 2013-05-07 ENCOUNTER — Encounter (HOSPITAL_COMMUNITY): Payer: Self-pay | Admitting: Anesthesiology

## 2013-05-07 ENCOUNTER — Encounter (HOSPITAL_COMMUNITY)
Admission: RE | Disposition: A | Discharge status: Home / Self Care | Payer: Self-pay | Source: Ambulatory Visit | Attending: Obstetrics and Gynecology

## 2013-05-07 DIAGNOSIS — N938 Other specified abnormal uterine and vaginal bleeding: Secondary | ICD-10-CM | POA: Insufficient documentation

## 2013-05-07 DIAGNOSIS — K219 Gastro-esophageal reflux disease without esophagitis: Secondary | ICD-10-CM | POA: Insufficient documentation

## 2013-05-07 DIAGNOSIS — N840 Polyp of corpus uteri: Secondary | ICD-10-CM | POA: Insufficient documentation

## 2013-05-07 DIAGNOSIS — N949 Unspecified condition associated with female genital organs and menstrual cycle: Secondary | ICD-10-CM | POA: Insufficient documentation

## 2013-05-07 DIAGNOSIS — J45909 Unspecified asthma, uncomplicated: Secondary | ICD-10-CM | POA: Insufficient documentation

## 2013-05-07 HISTORY — PX: DILATATION & CURETTAGE/HYSTEROSCOPY WITH TRUECLEAR: SHX6353

## 2013-05-07 LAB — CBC
HCT: 34.8 % — ABNORMAL LOW (ref 36.0–46.0)
Hemoglobin: 11.4 g/dL — ABNORMAL LOW (ref 12.0–15.0)
MCH: 29.2 pg (ref 26.0–34.0)
MCHC: 32.8 g/dL (ref 30.0–36.0)
MCV: 89.2 fL (ref 78.0–100.0)
PLATELETS: 292 10*3/uL (ref 150–400)
RBC: 3.9 MIL/uL (ref 3.87–5.11)
RDW: 13.1 % (ref 11.5–15.5)
WBC: 8.6 10*3/uL (ref 4.0–10.5)

## 2013-05-07 SURGERY — DILATATION & CURETTAGE/HYSTEROSCOPY WITH TRUCLEAR
Anesthesia: General | Site: Vagina

## 2013-05-07 MED ORDER — MIDAZOLAM HCL 2 MG/2ML IJ SOLN
INTRAMUSCULAR | Status: AC
  Filled 2013-05-07: qty 2

## 2013-05-07 MED ORDER — MEPERIDINE HCL 25 MG/ML IJ SOLN: 6.2500 mg | INTRAMUSCULAR | Status: DC | PRN

## 2013-05-07 MED ORDER — FENTANYL CITRATE 0.05 MG/ML IJ SOLN: 100.0000 ug | Freq: Once | INTRAMUSCULAR | Status: DC

## 2013-05-07 MED ORDER — FENTANYL CITRATE 0.05 MG/ML IJ SOLN
INTRAMUSCULAR | Status: AC
  Administered 2013-05-07: 100 ug via INTRAVENOUS
  Filled 2013-05-07: qty 2

## 2013-05-07 MED ORDER — PROPOFOL 10 MG/ML IV EMUL
INTRAVENOUS | Status: AC
  Filled 2013-05-07: qty 20

## 2013-05-07 MED ORDER — ONDANSETRON HCL 4 MG/2ML IJ SOLN
INTRAMUSCULAR | Status: AC
  Filled 2013-05-07: qty 2

## 2013-05-07 MED ORDER — KETOROLAC TROMETHAMINE 30 MG/ML IJ SOLN
INTRAMUSCULAR | Status: DC | PRN
  Administered 2013-05-07: 30 mg via INTRAVENOUS

## 2013-05-07 MED ORDER — FENTANYL CITRATE 0.05 MG/ML IJ SOLN
25.0000 ug | INTRAMUSCULAR | Status: DC | PRN
  Administered 2013-05-07 (×2): 25 ug via INTRAVENOUS

## 2013-05-07 MED ORDER — CEFAZOLIN SODIUM-DEXTROSE 2-3 GM-% IV SOLR: 2.0000 g | INTRAVENOUS | Status: DC

## 2013-05-07 MED ORDER — SCOPOLAMINE 1 MG/3DAYS TD PT72
1.0000 | MEDICATED_PATCH | TRANSDERMAL | Status: DC
  Administered 2013-05-07: 1.5 mg via TRANSDERMAL

## 2013-05-07 MED ORDER — FENTANYL CITRATE 0.05 MG/ML IJ SOLN
INTRAMUSCULAR | Status: DC | PRN
  Administered 2013-05-07: 50 ug via INTRAVENOUS

## 2013-05-07 MED ORDER — FENTANYL CITRATE 0.05 MG/ML IJ SOLN
INTRAMUSCULAR | Status: AC
  Administered 2013-05-07: 25 ug via INTRAVENOUS
  Filled 2013-05-07: qty 2

## 2013-05-07 MED ORDER — METOCLOPRAMIDE HCL 5 MG/ML IJ SOLN: 10.0000 mg | Freq: Once | INTRAMUSCULAR | Status: DC | PRN

## 2013-05-07 MED ORDER — DEXAMETHASONE SODIUM PHOSPHATE 10 MG/ML IJ SOLN
INTRAMUSCULAR | Status: DC | PRN
  Administered 2013-05-07: 10 mg via INTRAVENOUS

## 2013-05-07 MED ORDER — ONDANSETRON HCL 4 MG/2ML IJ SOLN
INTRAMUSCULAR | Status: DC | PRN
  Administered 2013-05-07: 4 mg via INTRAVENOUS

## 2013-05-07 MED ORDER — LIDOCAINE HCL (CARDIAC) 20 MG/ML IV SOLN
INTRAVENOUS | Status: DC | PRN
  Administered 2013-05-07: 70 mg via INTRAVENOUS
  Administered 2013-05-07: 30 mg via INTRAVENOUS

## 2013-05-07 MED ORDER — LACTATED RINGERS IV SOLN
INTRAVENOUS | Status: DC
  Administered 2013-05-07: 07:00:00 via INTRAVENOUS

## 2013-05-07 MED ORDER — PROPOFOL 10 MG/ML IV BOLUS
INTRAVENOUS | Status: DC | PRN
  Administered 2013-05-07: 180 mg via INTRAVENOUS

## 2013-05-07 MED ORDER — SCOPOLAMINE 1 MG/3DAYS TD PT72
MEDICATED_PATCH | TRANSDERMAL | Status: AC
  Filled 2013-05-07: qty 1

## 2013-05-07 MED ORDER — LIDOCAINE HCL 1 % IJ SOLN
INTRAMUSCULAR | Status: DC | PRN
  Administered 2013-05-07: 10 mL

## 2013-05-07 MED ORDER — CEFAZOLIN SODIUM-DEXTROSE 2-3 GM-% IV SOLR
INTRAVENOUS | Status: DC | PRN
  Administered 2013-05-07: 2 g via INTRAVENOUS

## 2013-05-07 MED ORDER — LIDOCAINE HCL (CARDIAC) 20 MG/ML IV SOLN
INTRAVENOUS | Status: AC
  Filled 2013-05-07: qty 5

## 2013-05-07 MED ORDER — FENTANYL CITRATE 0.05 MG/ML IJ SOLN
INTRAMUSCULAR | Status: AC
  Filled 2013-05-07: qty 2

## 2013-05-07 MED ORDER — MIDAZOLAM HCL 2 MG/2ML IJ SOLN
INTRAMUSCULAR | Status: DC | PRN
  Administered 2013-05-07: 2 mg via INTRAVENOUS

## 2013-05-07 MED ORDER — CEFAZOLIN SODIUM-DEXTROSE 2-3 GM-% IV SOLR
INTRAVENOUS | Status: AC
  Filled 2013-05-07: qty 50

## 2013-05-07 MED ORDER — KETOROLAC TROMETHAMINE 30 MG/ML IJ SOLN
INTRAMUSCULAR | Status: AC
  Filled 2013-05-07: qty 1

## 2013-05-07 MED ORDER — DEXAMETHASONE SODIUM PHOSPHATE 10 MG/ML IJ SOLN
INTRAMUSCULAR | Status: AC
  Filled 2013-05-07: qty 1

## 2013-05-07 MED ORDER — LIDOCAINE HCL 1 % IJ SOLN
INTRAMUSCULAR | Status: AC
  Filled 2013-05-07: qty 20

## 2013-05-07 MED ORDER — SODIUM CHLORIDE 0.9 % IR SOLN
Status: DC | PRN
  Administered 2013-05-07: 3000 mL

## 2013-05-07 MED ORDER — HYDROCODONE-IBUPROFEN 7.5-200 MG PO TABS: 1.0000 | ORAL_TABLET | Freq: Three times a day (TID) | ORAL | Status: AC | PRN

## 2013-05-07 SURGICAL SUPPLY — 21 items
BLADE INCISOR TRUC PLUS 2.9 (ABLATOR) ×1 IMPLANT
CANISTERS HI-FLOW 3000CC (CANNISTER) ×6 IMPLANT
CATH ROBINSON RED A/P 16FR (CATHETERS) ×3 IMPLANT
CLOTH BEACON ORANGE TIMEOUT ST (SAFETY) ×3 IMPLANT
CONTAINER PREFILL 10% NBF 60ML (FORM) ×6 IMPLANT
DRAPE HYSTEROSCOPY (DRAPE) ×3 IMPLANT
DRSG TELFA 3X8 NADH (GAUZE/BANDAGES/DRESSINGS) ×3 IMPLANT
GLOVE BIO SURGEON STRL SZ 6.5 (GLOVE) ×2 IMPLANT
GLOVE BIO SURGEONS STRL SZ 6.5 (GLOVE) ×1
GLOVE BIOGEL PI IND STRL 7.0 (GLOVE) ×1 IMPLANT
GLOVE BIOGEL PI INDICATOR 7.0 (GLOVE) ×2
GOWN STRL REUS W/TWL LRG LVL3 (GOWN DISPOSABLE) ×6 IMPLANT
INCISOR TRUC PLUS BLADE 2.9 (ABLATOR) ×3
KIT HYSTEROSCOPY TRUCLEAR (ABLATOR) ×3 IMPLANT
MORCELLATOR RECIP TRUCLEAR 4.0 (ABLATOR) IMPLANT
NEEDLE SPNL 22GX3.5 QUINCKE BK (NEEDLE) ×3 IMPLANT
PACK VAGINAL MINOR WOMEN LF (CUSTOM PROCEDURE TRAY) ×3 IMPLANT
PAD OB MATERNITY 4.3X12.25 (PERSONAL CARE ITEMS) ×3 IMPLANT
SYR CONTROL 10ML LL (SYRINGE) ×3 IMPLANT
TOWEL OR 17X24 6PK STRL BLUE (TOWEL DISPOSABLE) ×6 IMPLANT
WATER STERILE IRR 1000ML POUR (IV SOLUTION) ×3 IMPLANT

## 2013-05-07 NOTE — Op Note (Signed)
NAMMarland Kitchen:  Kayla Mason, [name]          ACCOUNT NO.:  [id number]  MEDICAL RECORD NO.:  [id number]  LOCATION:  [facility]                          [facility]ACILITY:  [facility]  PHYSICIAN:  Zelphia CairoGretchen Ercole Georg, MD    DATE OF BIRTH:  [birth year]  DATE OF PROCEDURE:  05/07/2013 DATE OF DISCHARGE:                              OPERATIVE REPORT   PREOPERATIVE DIAGNOSIS:  Irregular bleeding and endometrial polyps.  POSTOPERATIVE DIAGNOSIS:  Irregular bleeding and endometrial polyps, path pending.  PROCEDURE: 1. Cervical block. 2. Hysteroscopy. 3. D and C. 4. Resection of endometrial polyp.  SURGEON:  Zelphia CairoGretchen Niquan Charnley, MD  ANESTHESIA:  General.  COMPLICATIONS:  None.  SPECIMEN:  Endometrial curettings with polyps.  CONDITION:  Stable to recovery room.  FLUID DEFICIT:  10 mL.  DESCRIPTION OF PROCEDURE:  The patient was taken to the operating room after anesthesia was found to be adequate. She was placed in the dorsal lithotomy position using Allen stirrups, prepped and draped in sterile fashion.  In and out catheter was used to drain her bladder.  Bivalve speculum was placed in the vagina and a single-tooth tenaculum was attached to the anterior lip of the cervix.  1% lidocaine was used to provide a cervical block.  The cervix was serially dilated using Pratt dilators and the hysteroscope was inserted 2 polypoid masses were identified.  The TRUCLEAR blade was inserted through the hysteroscope and polyps were easily resected to the base.  Hysteroscope was removed and a gentle curetting was performed posteriorly as there seemed to be some fluffy endometrium and blood clot in this region.  All instruments were then removed from the vagina.  Tenaculum was removed and the cervix was hemostatic.  Speculum was removed.  She was extubated and taken to the recovery room in stable condition.  Sponge, lap, needle, instrument counts were correct x2.     Zelphia CairoGretchen Timmia Cogburn, MD     GA/MEDQ  D:  05/07/2013  T:   05/07/2013  Job:  [id number]

## 2013-05-07 NOTE — Transfer of Care (Signed)
Immediate Anesthesia Transfer of Care Note  Patient: Kayla MannersSarah Dreier-Kasik  Procedure(s) Performed: Procedure(s): DILATATION & CURETTAGE/HYSTEROSCOPY WITH TRUCLEAR (N/A)  Patient Location: PACU  Anesthesia Type:General  Level of Consciousness: awake, sedated and patient cooperative  Airway & Oxygen Therapy: Patient Spontanous Breathing and Patient connected to nasal cannula oxygen  Post-op Assessment: Report given to PACU RN and Post -op Vital signs reviewed and stable  Post vital signs: Reviewed and stable  Complications: No apparent anesthesia complications

## 2013-05-07 NOTE — Discharge Instructions (Signed)
[facility] office 2-3 weeks for postop appointment.  Call the office [phone] for an appointment. ° °Personal Hygiene: °Use pads not tampons x 1week °You may shower, no tub baths or pools for 2-3 weeks °Wipe from front to back when using restroom ° °Activity: °Do not drive or operate any equipment for 24 hrs.   °Do not rest in bed all day °Walking is encouraged °Walk up and down stairs slowly °You may return to your normal activity in 1-2 days ° °Sexual Activity:  No intercourse for 2 weeks after the procedure. ° °Diet: Eat a light meal as desired this evening.  You may resume your usual diet tomorrow. ° °Return to work:  You may resume your work activities after 1-2 days ° °What to expect:  Expect to have vaginal bleeding/discharge for 2-3 days and spotting for 10-14 days.  It is not unusual to have soreness for 1-2 weeks.  You may have a slight burning sensation when you urinate for the first few days.  You may start your menses in 2-6 weeks.  Mild cramps may continue for a couple of days.   ° °Call your doctor:   °Excessive bleeding, saturating a pad every hour °Inability to urinate 6 hours after discharge °Pain not relieved with pain medications °Fever of 100.4 or greater ° °

## 2013-05-07 NOTE — Anesthesia Postprocedure Evaluation (Signed)
Anesthesia Post Note  Patient: Kayla Mason  Procedure(s) Performed: Procedure(s) (LRB): DILATATION & CURETTAGE/HYSTEROSCOPY WITH TRUCLEAR (N/A)  Anesthesia type: GA  Patient location: PACU  Post pain: Pain level controlled  Post assessment: Post-op Vital signs reviewed  Last Vitals:  Filed Vitals:   05/07/13 0830  BP:   Pulse: 57  Temp:   Resp: 17    Post vital signs: Reviewed  Level of consciousness: sedated  Complications: No apparent anesthesia complications

## 2013-05-10 ENCOUNTER — Encounter (HOSPITAL_COMMUNITY): Payer: Self-pay | Admitting: Obstetrics and Gynecology

## 2013-05-12 ENCOUNTER — Telehealth: Payer: Self-pay | Admitting: *Deleted

## 2013-05-12 NOTE — Telephone Encounter (Signed)
Request from Dr. Zenovia JordanAngela Hawkes to fax labs that were done on patient by Dr. Daiva EvesVan Dam 03/08/13. She is a mutual patient and labs faxed to [phone]. Wendall MolaJacqueline Jaidee Stipe

## 2013-07-15 ENCOUNTER — Other Ambulatory Visit: Payer: Self-pay | Admitting: Gynecology

## 2013-08-11 ENCOUNTER — Other Ambulatory Visit: Payer: Self-pay | Admitting: Gynecology

## 2014-05-02 ENCOUNTER — Other Ambulatory Visit: Payer: Self-pay | Admitting: Gynecology

## 2014-05-04 LAB — CYTOLOGY - PAP

## 2014-10-13 ENCOUNTER — Other Ambulatory Visit: Payer: Self-pay | Admitting: Gynecology

## 2014-10-17 LAB — CYTOLOGY - PAP

## 2015-06-22 ENCOUNTER — Encounter: Payer: Self-pay | Admitting: Allergy and Immunology

## 2015-06-22 ENCOUNTER — Ambulatory Visit (INDEPENDENT_AMBULATORY_CARE_PROVIDER_SITE_OTHER): Payer: BC Managed Care – PPO | Admitting: Allergy and Immunology

## 2015-06-22 VITALS — BP 114/72 | HR 76 | Temp 97.7°F | Resp 16 | Ht 64.76 in | Wt 190.8 lb

## 2015-06-22 DIAGNOSIS — H1010 Acute atopic conjunctivitis, unspecified eye: Secondary | ICD-10-CM | POA: Diagnosis not present

## 2015-06-22 DIAGNOSIS — R05 Cough: Secondary | ICD-10-CM | POA: Diagnosis not present

## 2015-06-22 DIAGNOSIS — R059 Cough, unspecified: Secondary | ICD-10-CM

## 2015-06-22 DIAGNOSIS — J309 Allergic rhinitis, unspecified: Secondary | ICD-10-CM

## 2015-06-22 MED ORDER — MONTELUKAST SODIUM 10 MG PO TABS: ORAL_TABLET | ORAL | Status: AC

## 2015-06-22 MED ORDER — ALBUTEROL SULFATE HFA 108 (90 BASE) MCG/ACT IN AERS: INHALATION_SPRAY | RESPIRATORY_TRACT | Status: AC

## 2015-06-22 NOTE — Patient Instructions (Signed)
Take Home Sheet  1. Avoidance: Mite, Mold and Pollen   2. Antihistamine: Zyrtec 10mg  by mouth once daily for runny nose or itching.   3. Nasal Spray: Rhinocort AQ 1-2 spray(s) each nostril once daily for stuffy nose or drainage.    4. Inhalers:  Rescue: ProAir 2 puffs every 4 hours as needed for cough or wheeze.       -May use 2 puffs 10-20 minutes prior to exercise.   5.  Singulair 10mg  each evening.   6. Nasal Saline wash each evening at shower time.   7. Follow up Visit: 6 weeks or sooner if needed.   Websites that have reliable Patient information: 1. American Academy of Asthma, Allergy, & Immunology: www.aaaai.org 2. Food Allergy Network: www.foodallergy.org 3. Mothers of Asthmatics: www.aanma.org 4. [facility]: https://www.strong.com/www.njc.org 5. American College of Allergy, Asthma, & Immunology: BiggerRewards.iswww.allergy.mcg.edu or www.acaai.org  Control of House Dust Mite Allergen  House dust mites play a major role in allergic asthma and rhinitis.  They occur in environments with high humidity wherever human skin, the food for dust mites is found. High levels have been detected in dust obtained from mattresses, pillows, carpets, upholstered furniture, bed covers, clothes and soft toys.  The principal allergen of the house dust mite is found in its feces.  A gram of dust may contain 1,000 mites and 250,000 fecal particles.  Mite antigen is easily measured in the air during house cleaning activities.  1. Encase mattresses, including the box spring, and pillow, in an air tight cover.  Seal the zipper end of the encased mattresses with wide adhesive tape. 2. Wash the bedding in water of 130 degrees Farenheit weekly.  Avoid cotton comforters/quilts and flannel bedding: the most ideal bed covering is the dacron comforter. 3. Remove all upholstered furniture from the bedroom. 4. Remove carpets, carpet padding, rugs, and non-washable window drapes from the bedroom.  Wash  drapes weekly or use plastic window coverings. 5. Remove all non-washable stuffed toys from the bedroom.  Wash stuffed toys weekly. 6. Have the room cleaned frequently with a vacuum cleaner and a damp dust-mop.  The patient should not be in a room which is being cleaned and should wait 1 hour after cleaning before going into the room. 7. Close and seal all heating outlets in the bedroom.  Otherwise, the room will become filled with dust-laden air.  An electric heater can be used to heat the room. 8. Reduce indoor humidity to less than 50%.  Do not use a humidifier.  Reducing Pollen Exposure  The American Academy of Allergy, Asthma and Immunology suggests the following steps to reduce your exposure to pollen during allergy seasons.  9. Do not hang sheets or clothing out to dry; pollen may collect on these items. 10. Do not mow lawns or spend time around freshly cut grass; mowing stirs up pollen. 11. Keep windows closed at night.  Keep car windows closed while driving. 12. Minimize morning activities outdoors, a time when pollen counts are usually at their highest. 13. Stay indoors as much as possible when pollen counts or humidity is high and on windy days when pollen tends to remain in the air longer. 14. Use air conditioning when possible.  Many air conditioners have filters that trap the pollen spores. 15. Use a HEPA room air filter to remove pollen form the indoor air you breathe.  Control of Mold Allergen  Mold and fungi can grow on a variety of surfaces provided  certain temperature and moisture conditions exist.  Outdoor molds grow on plants, decaying vegetation and soil.  The major outdoor mold, Alternaria dn Cladosporium, are found in very high numbers during hot and dry conditions.  Generally, a late Summer - Fall peak is seen for common outdoor fungal spores.  Rain will temporarily lower outdoor mold spore count, but counts rise rapidly when the rainy period ends.  The most important  indoor molds are Aspergillus and Penicillium.  Dark, humid and poorly ventilated basements are ideal sites for mold growth.  The next most common sites of mold growth are the bathroom and the kitchen.  Outdoor [employer] 1. Use air conditioning and keep windows closed 2. Avoid exposure to decaying vegetation. 3. Avoid leaf raking. 4. Avoid grain handling. 5. Consider wearing a face mask if working in moldy areas.  Indoor Mold Control 1. Maintain humidity below 50%. 2. Clean washable surfaces with 5% bleach solution. 3. Remove sources e.g. Contaminated carpets.  Control of Cockroach Allergen  Cockroach allergen has been identified as an important cause of acute attacks of asthma, especially in urban settings.  There are fifty-five species of cockroach that exist in the [place], however only three, the [place], [place] species produce allergen that can affect patients with Asthma.  Allergens can be obtained from fecal particles, egg casings and secretions from cockroaches.  1. Remove food sources. 2. Reduce access to water. 3. Seal access and entry points. 4. Spray runways with 0.5-1% Diazinon or Chlorpyrifos 5. Blow boric acid power under stoves and refrigerator. 6. Place bait stations (hydramethylnon) at feeding sites.

## 2015-06-22 NOTE — Progress Notes (Signed)
NEW PATIENT NOTE  RE: Kayla Mason MRN: [id number] DOB: 02/21/1980 [facility] [address] [place] [address] Date of Office Visit: 06/22/2015  Dear Clayborn Heron, MD:  I had the pleasure of seeing Kayla Mason today in initial evaluation, as you recall-- Subjective:  Kayla Mason is a 35 y.o. female who presents today for New Patient (Initial Visit)  Assessment:   1. Cough, Intermittent, appears multifactorial.    2. Allergic rhinoconjunctivitis, seasonal and perennial hypersensitivities.    3.      Patient reports history of childhood asthma. Plan:   Meds ordered this encounter  Medications  . albuterol (PROAIR HFA) 108 (90 Base) MCG/ACT inhaler    Sig: Use 2 puffs every four hours as needed for cough or wheeze.  May use 2 puffs 10-20 minutes prior to exercise.    Dispense:  1 Inhaler    Refill:  1  . montelukast (SINGULAIR) 10 MG tablet    Sig: Take one each evening at bedtime to prevent cough or wheeze.    Dispense:  30 tablet    Refill:  5  1. Avoidance: Mite, Mold and Pollen 2. Antihistamine: Zyrtec 10mg  by mouth once daily for runny nose or itching. 3. Nasal Spray: Rhinocort AQ 1-2 spray(s) each nostril once daily for stuffy nose or drainage.  4. Inhalers:  Rescue: ProAir 2 puffs every 4 hours as needed for cough or wheeze.       -May use 2 puffs 10-20 minutes prior to exercise. 5.  Singulair 10mg  each evening. 6. Nasal Saline wash each evening at shower time. 7. Follow up Visit: 6 weeks or sooner if needed.  HPI: Kayla Mason presents to the office reporting a 20 year history of rhinorrhea, congestion, sneezing, itchy watery eyes, often associated with cough, year-round.  She reports increasing symptoms in the spring and fall with throat clearing, postnasal drip, ear/facial itching which is particularly more prominent with cat, pollen, outdoor, fluctuant weather pattern, strong odor/cigarette/ perfume exposures.  Zyrtec  seems of greater benefit than Claritin but has not used nasal sprays or other anti-histamines. She notices daily, occasional nocturnal and exercise-induced symptoms and believes that she snores.  She believes there was a diagnosis of asthma in childhood, but no recent albuterol use. Denies ED or Urgent care visits, prednisone or antibiotic courses.  She recalls a normal chest x-ray.  No chronic skin difficulties, history of eczema, hives or other rashes or known acute food reactions.  She has noted loose stools, gas with dairy products and increasing reflux with peppers or broccoli.  Medical History: Past Medical History  Diagnosis Date  . Asthma   . Frequent headaches   . GERD (gastroesophageal reflux disease)   . Jaundice     as newborn  . Migraines   . UTI (lower urinary tract infection)    Surgical History: Past Surgical History  Procedure Laterality Date  . Pic line/shunt  1998    for migraines/hospitalization  . Wisdom tooth extraction    . Dilatation & curettage/hysteroscopy with trueclear N/A 05/07/2013    Procedure: DILATATION & CURETTAGE/HYSTEROSCOPY WITH TRUCLEAR;  Surgeon: Zelphia Cairo, MD;  Location: [facility];  Service: Gynecology;  Laterality: N/A;  . Spinal tap  1997   Family History: Family History  Problem Relation Age of Onset  . Arthritis Mother   . Breast cancer Mother   . Diabetes Mother   . Allergic rhinitis Mother   . Hyperlipidemia Father   . Allergic rhinitis Father   .  Hyperlipidemia Paternal Aunt   . Hypertension Paternal Aunt   . Ovarian cancer Paternal Aunt   . Prostate cancer Paternal Uncle   . Liver cancer Paternal Grandfather   . Allergic rhinitis Sister   . Allergic rhinitis Brother   . Asthma Brother   . Immunodeficiency Neg Hx   . Urticaria Neg Hx   . Allergic rhinitis Daughter   . Eczema Daughter    Social History: Social History  . Marital Status: Divorced    Spouse Name: N/A  . Number of Children: 1  . Years of Education: N/A    Social History Main Topics  . Smoking status: Never Smoker   . Smokeless tobacco: Never Used  . Alcohol Use: No  . Drug Use: No  . Sexual Activity: Yes    Birth Control/ Protection: IUD   Social History Narrative   Work or School: [employer]      Home Situation: Lives with cousin and daughter      Spiritual Beliefs: Roman Catholic; Media planner      Lifestyle: walks 2x per week; diet is not great - she does watch portion sizes   Rhona has a current medication list which includes the following prescription(s): cetirizine, ibuprofen, peppermint oil.   Drug Allergies: Allergies  Allergen Reactions  . Tylenol [Acetaminophen] Other (See Comments)    migranes   . Witch [employer]  . Phenobarbital Sodium Rash    Possible allergy   Environmental History: Jauna lives in a 37 year old house for 8 months with Wood/carpet floors, with central heat and air; stuffed mattress, non-feather pillow/comforter, indoor dog and cat, without humidifier or smokers.   Review of Systems  Constitutional: Negative for fever, weight loss and malaise/fatigue.       Childhood varicella disease.  HENT: Positive for congestion. Negative for ear pain, hearing loss, nosebleeds and sore throat.   Eyes: Negative for discharge and redness.       Corrective eyeglasses lenses for 20 years.  Respiratory: Negative for shortness of breath.        Denies history of pneumonia.  Gastrointestinal: Negative for heartburn, nausea, vomiting, abdominal pain, diarrhea and constipation.  Genitourinary: Negative.   Musculoskeletal: Negative for myalgias and joint pain.  Skin: Negative.  Negative for itching and rash.  Neurological: Positive for headaches. Negative for dizziness, seizures and weakness.  Endo/Heme/Allergies: Positive for environmental allergies.       Denies sensitivity to aspirin, NSAIDs, stinging insects, foods, latex, jewelry and cosmetics.  Immunological: No chronic or recurring infections. Objective:    Filed Vitals:   06/22/15 0933  BP: 114/72  Pulse: 76  Temp: 97.7 F (36.5 C)  Resp: 16   SpO2 Readings from Last 1 Encounters:  06/22/15 97%   Physical Exam  Constitutional: She is well-developed, well-nourished, and in no distress.  HENT:  Head: Atraumatic.  Right Ear: Tympanic membrane and ear canal normal.  Left Ear: Tympanic membrane and ear canal normal.  Nose: Mucosal edema present. No rhinorrhea. No epistaxis.  Mouth/Throat: Oropharynx is clear and moist and mucous membranes are normal. No oropharyngeal exudate, posterior oropharyngeal edema or posterior oropharyngeal erythema.  Eyes: Conjunctivae are normal.  Neck: Neck supple.  Cardiovascular: Normal rate, S1 normal and S2 normal.   No murmur heard. Pulmonary/Chest: Effort normal. She has no wheezes. She has no rhonchi. She has no rales.  Abdominal: Soft. Normal appearance and bowel sounds are normal.  Musculoskeletal: She exhibits no edema.  Lymphadenopathy:    She has  no cervical adenopathy.  Neurological: She is alert.  Skin: Skin is warm and intact. No rash noted. No cyanosis. Nails show no clubbing.   Diagnostics: Spirometry:  FVC 3.28--93%, FEV1 2.76--- 93%.    Skin testing:  Very strong reactivity to ragweed pollen, strong reactivity to cat hair dust mite mix and perennial rye grass pollen and mild reactivity to Aspergillus/penicillium mold mix; otherwise, negative to selective foods     Naseem Adler M. Willa RoughHicks, MD   cc: Clayborn HeronVictoria R Rankins, MD

## 2015-07-07 ENCOUNTER — Encounter: Payer: Self-pay | Admitting: Allergy and Immunology

## 2015-09-12 ENCOUNTER — Telehealth: Payer: Self-pay | Admitting: Pediatrics

## 2015-09-12 NOTE — Telephone Encounter (Signed)
Please check over patient account and call to clarify if there is actually a balance remaining. Pt stated that she received a bill for $1000.00 in the mail. I didn't see a balance on my end.

## 2015-09-18 NOTE — Telephone Encounter (Signed)
LEFT MESSAGE - 0 BAL

## 2016-02-05 IMAGING — CR DG CHEST 2V
3 series · 3 of 3 positions shown · non-contrast
Comparison: None.

CLINICAL DATA: Dyspnea on exertion

EXAM:
CHEST  2 VIEW

[w chest pa]
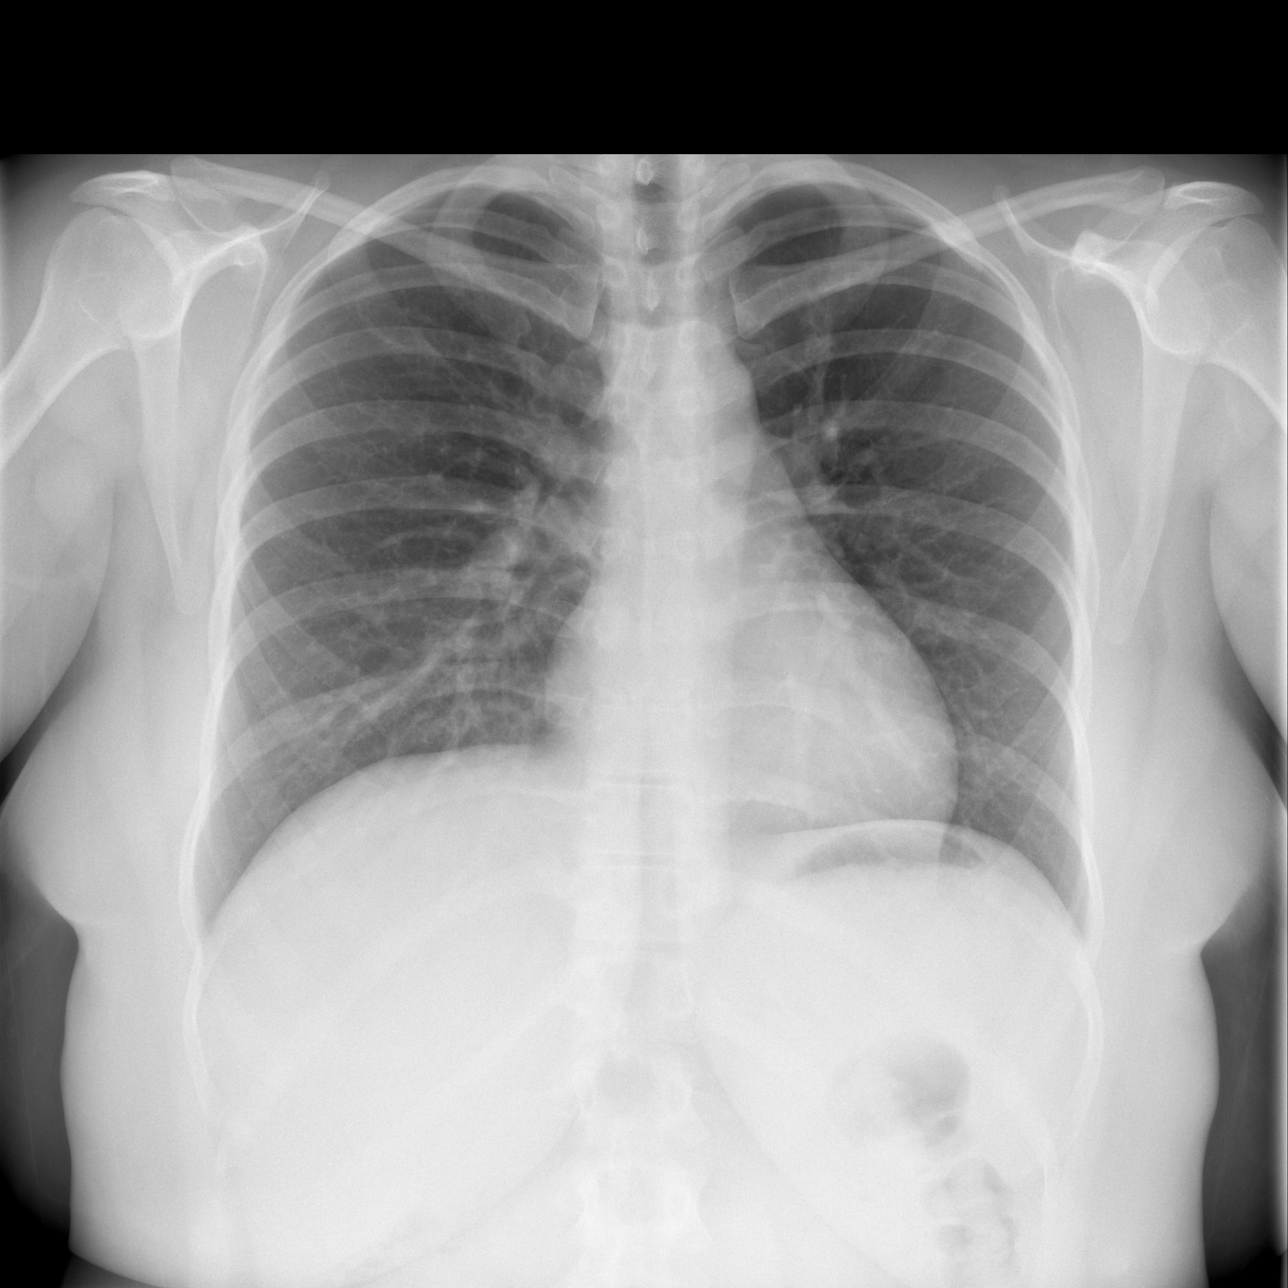

[w chest lat (1 of 2)]
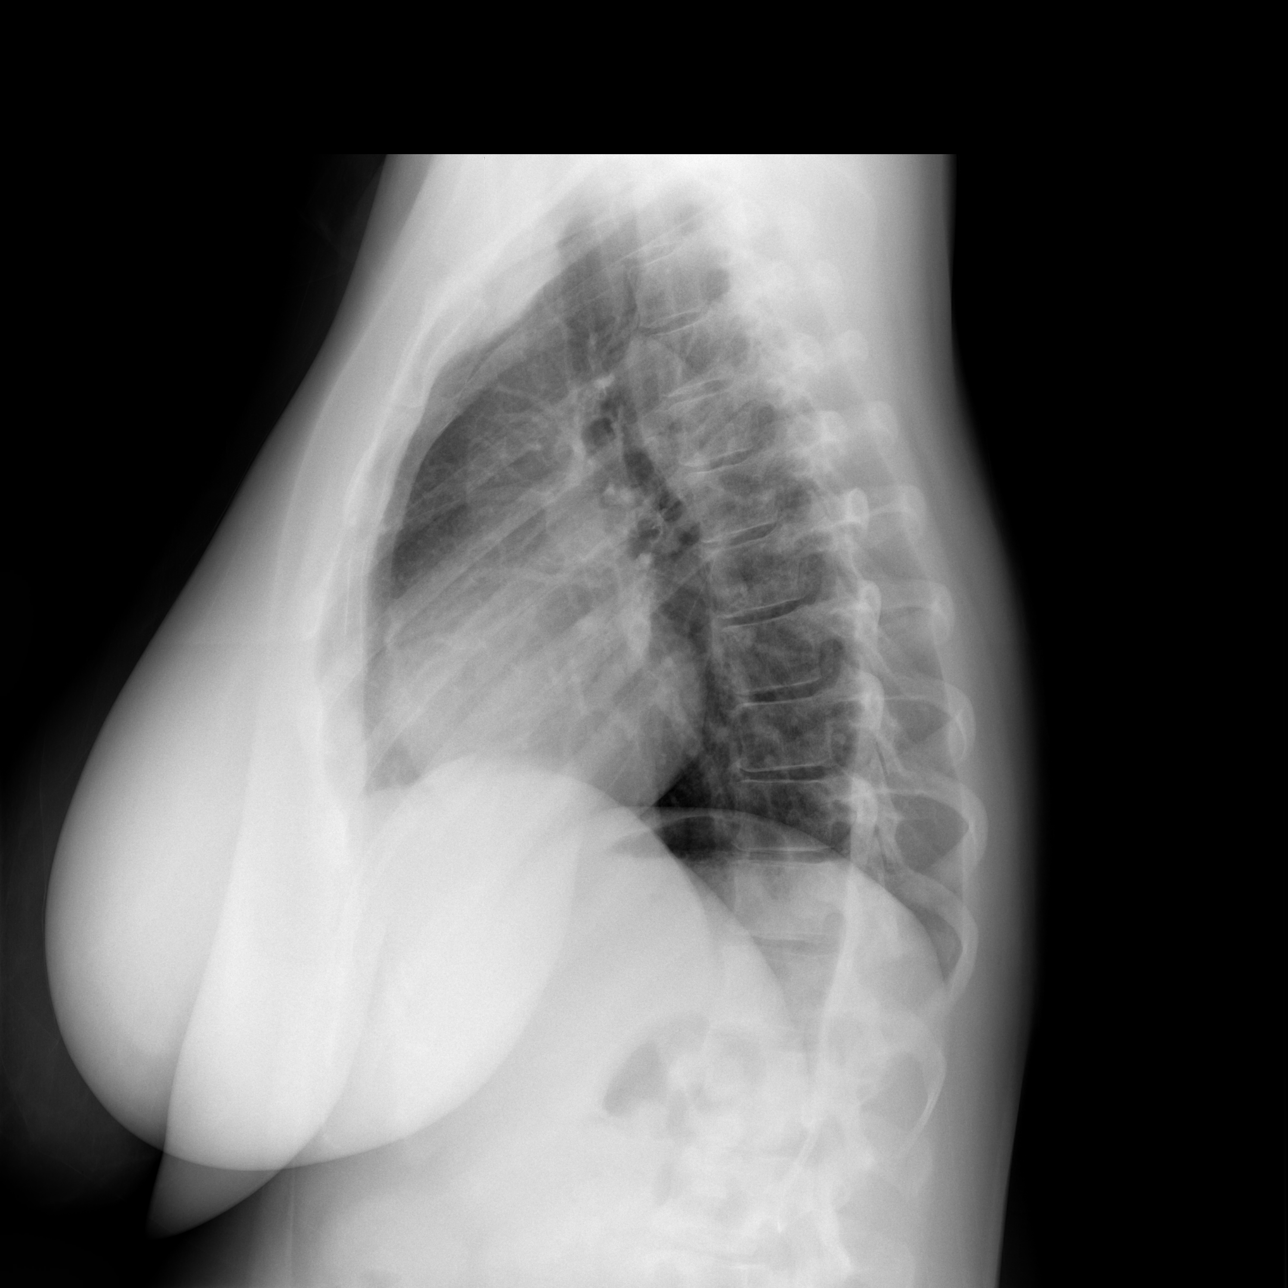

[w chest lat (2 of 2)]
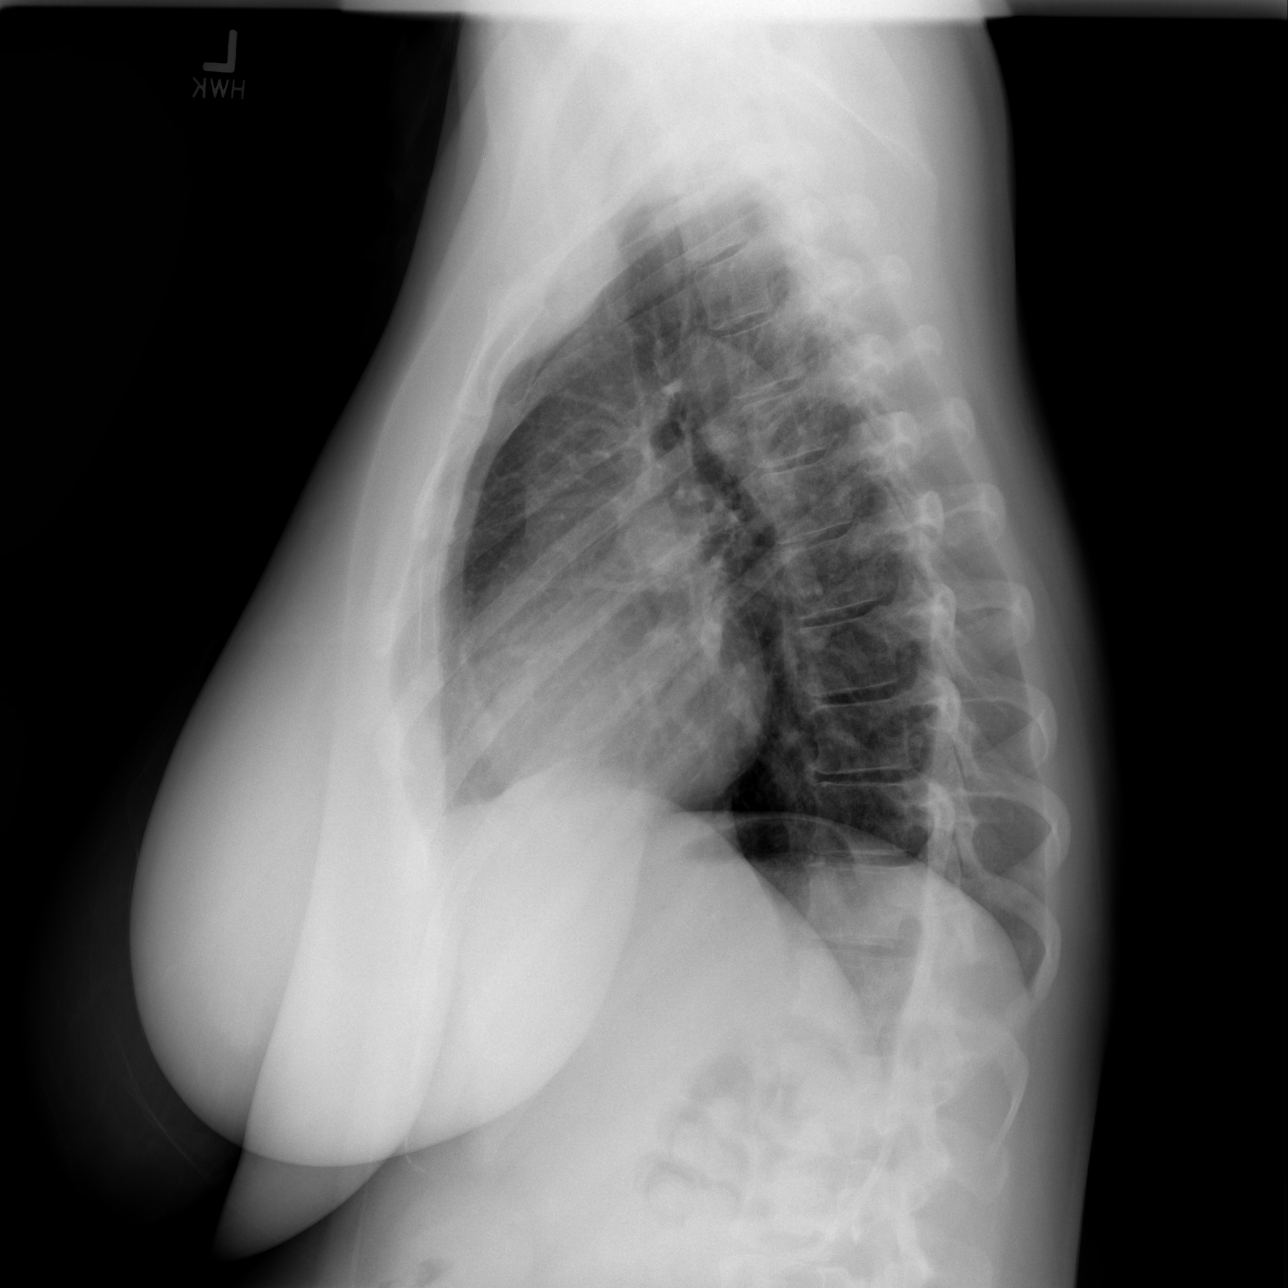

[3 of 3 positions shown; findings below may reference images not displayed]

FINDINGS: No active infiltrate or effusion is seen. Mediastinal contours
appear normal. No adenopathy is seen. The heart is within normal
limits in size. No bony abnormality is seen.
IMPRESSION: No active lung disease.
